# Patient Record
Sex: Female | Born: 1975 | Hispanic: No | Marital: Married | Smoking: Never smoker
Health system: Southern US, Community
[De-identification: ages and names within clinical notes are randomized; demographics above are authoritative.]

## PROBLEM LIST (undated history)

## (undated) DIAGNOSIS — I10 Essential (primary) hypertension: Secondary | ICD-10-CM

## (undated) HISTORY — DX: Essential (primary) hypertension: I10

---

## 1998-09-30 ENCOUNTER — Emergency Department (HOSPITAL_COMMUNITY): Admission: EM | Admit: 1998-09-30 | Discharge: 1998-09-30 | Payer: Self-pay | Admitting: Internal Medicine

## 2004-01-17 ENCOUNTER — Inpatient Hospital Stay (HOSPITAL_COMMUNITY): Admission: AD | Admit: 2004-01-17 | Discharge: 2004-01-17 | Payer: Self-pay | Admitting: *Deleted

## 2004-01-20 ENCOUNTER — Ambulatory Visit (HOSPITAL_COMMUNITY): Admission: RE | Admit: 2004-01-20 | Discharge: 2004-01-20 | Payer: Self-pay | Admitting: Family Medicine

## 2004-02-07 ENCOUNTER — Encounter: Admission: RE | Admit: 2004-02-07 | Discharge: 2004-02-07 | Payer: Self-pay | Admitting: Family Medicine

## 2004-03-06 ENCOUNTER — Encounter: Admission: RE | Admit: 2004-03-06 | Discharge: 2004-03-06 | Payer: Self-pay | Admitting: Family Medicine

## 2004-09-03 ENCOUNTER — Inpatient Hospital Stay (HOSPITAL_COMMUNITY): Admission: AD | Admit: 2004-09-03 | Discharge: 2004-09-03 | Payer: Self-pay | Admitting: Obstetrics & Gynecology

## 2004-09-24 ENCOUNTER — Ambulatory Visit: Payer: Self-pay | Admitting: Family Medicine

## 2004-10-01 ENCOUNTER — Ambulatory Visit: Payer: Self-pay | Admitting: Family Medicine

## 2004-10-08 ENCOUNTER — Ambulatory Visit: Payer: Self-pay | Admitting: Family Medicine

## 2004-10-22 ENCOUNTER — Ambulatory Visit: Payer: Self-pay | Admitting: Family Medicine

## 2004-11-12 ENCOUNTER — Ambulatory Visit: Payer: Self-pay | Admitting: Family Medicine

## 2004-11-23 ENCOUNTER — Ambulatory Visit (HOSPITAL_COMMUNITY): Admission: RE | Admit: 2004-11-23 | Discharge: 2004-11-23 | Payer: Self-pay | Admitting: *Deleted

## 2004-12-03 ENCOUNTER — Ambulatory Visit: Payer: Self-pay | Admitting: Family Medicine

## 2004-12-08 ENCOUNTER — Ambulatory Visit: Payer: Self-pay | Admitting: *Deleted

## 2004-12-11 ENCOUNTER — Ambulatory Visit (HOSPITAL_COMMUNITY): Admission: RE | Admit: 2004-12-11 | Discharge: 2004-12-11 | Payer: Self-pay | Admitting: *Deleted

## 2004-12-24 ENCOUNTER — Ambulatory Visit: Payer: Self-pay | Admitting: Family Medicine

## 2005-01-07 ENCOUNTER — Ambulatory Visit: Payer: Self-pay | Admitting: Family Medicine

## 2005-01-21 ENCOUNTER — Ambulatory Visit: Payer: Self-pay | Admitting: Family Medicine

## 2005-02-04 ENCOUNTER — Ambulatory Visit: Payer: Self-pay | Admitting: Family Medicine

## 2005-02-18 ENCOUNTER — Ambulatory Visit: Payer: Self-pay | Admitting: Family Medicine

## 2005-03-04 ENCOUNTER — Ambulatory Visit: Payer: Self-pay | Admitting: Obstetrics & Gynecology

## 2005-03-18 ENCOUNTER — Ambulatory Visit: Payer: Self-pay | Admitting: *Deleted

## 2005-03-25 ENCOUNTER — Ambulatory Visit: Payer: Self-pay | Admitting: Family Medicine

## 2005-04-01 ENCOUNTER — Ambulatory Visit: Payer: Self-pay | Admitting: Family Medicine

## 2005-04-06 ENCOUNTER — Inpatient Hospital Stay (HOSPITAL_COMMUNITY): Admission: AD | Admit: 2005-04-06 | Discharge: 2005-04-08 | Payer: Self-pay | Admitting: *Deleted

## 2006-04-21 ENCOUNTER — Inpatient Hospital Stay (HOSPITAL_COMMUNITY): Admission: AD | Admit: 2006-04-21 | Discharge: 2006-04-21 | Payer: Self-pay | Admitting: Obstetrics and Gynecology

## 2006-06-02 ENCOUNTER — Other Ambulatory Visit: Admission: RE | Admit: 2006-06-02 | Discharge: 2006-06-02 | Payer: Self-pay | Admitting: Obstetrics and Gynecology

## 2006-11-06 ENCOUNTER — Inpatient Hospital Stay (HOSPITAL_COMMUNITY): Admission: AD | Admit: 2006-11-06 | Discharge: 2006-11-06 | Payer: Self-pay | Admitting: Obstetrics and Gynecology

## 2006-11-10 ENCOUNTER — Inpatient Hospital Stay (HOSPITAL_COMMUNITY): Admission: AD | Admit: 2006-11-10 | Discharge: 2006-11-12 | Payer: Self-pay | Admitting: Obstetrics and Gynecology

## 2010-10-25 ENCOUNTER — Encounter: Payer: Self-pay | Admitting: *Deleted

## 2012-12-25 ENCOUNTER — Ambulatory Visit (INDEPENDENT_AMBULATORY_CARE_PROVIDER_SITE_OTHER): Payer: 59 | Admitting: Physician Assistant

## 2012-12-25 VITALS — BP 152/98 | HR 84 | Temp 98.0°F | Resp 16 | Ht 62.0 in | Wt 140.8 lb

## 2012-12-25 DIAGNOSIS — Z131 Encounter for screening for diabetes mellitus: Secondary | ICD-10-CM

## 2012-12-25 DIAGNOSIS — L259 Unspecified contact dermatitis, unspecified cause: Secondary | ICD-10-CM

## 2012-12-25 DIAGNOSIS — L237 Allergic contact dermatitis due to plants, except food: Secondary | ICD-10-CM

## 2012-12-25 DIAGNOSIS — L255 Unspecified contact dermatitis due to plants, except food: Secondary | ICD-10-CM

## 2012-12-25 LAB — GLUCOSE, POCT (MANUAL RESULT ENTRY): POC Glucose: 96 mg/dl (ref 70–99)

## 2012-12-25 MED ORDER — CLOBETASOL PROPIONATE 0.05 % EX CREA: TOPICAL_CREAM | Freq: Two times a day (BID) | CUTANEOUS | Status: AC

## 2012-12-25 MED ORDER — PREDNISONE 20 MG PO TABS: ORAL_TABLET | ORAL | Status: AC

## 2012-12-25 NOTE — Progress Notes (Addendum)
  Subjective:    Patient ID: Jessica Baldwin, female    DOB: 1976/04/01, 37 y.o.   MRN: [id number]  HPI 37 year old female presents with 1 week history of pruritic rash on bilateral forearms. States symptoms started on 12/18/12 the day after she was working out in the yard cutting trees.  The rash has progressively worsened and become more swollen and pruritic.  Has washed all clothes from that day. She tried OTC hydrocortisone cream yesterday which has helped with the itch.  BP also slightly elevated today and does admit she has been told this in the past but has never been diagnosed with HTN.    Otherwise healthy with no other concerns today.   She presented today with her daughter who translated for her.     Review of Systems  Constitutional: Negative for fever and chills.  Gastrointestinal: Negative for nausea and vomiting.  Musculoskeletal: Negative for joint swelling.  Skin: Positive for color change and rash. Negative for wound.       Objective:   Physical Exam  Constitutional: She is oriented to person, place, and time. She appears well-developed and well-nourished.  HENT:  Head: Normocephalic and atraumatic.  Right Ear: External ear normal.  Left Ear: External ear normal.  Eyes: Conjunctivae are normal.  Neck: Normal range of motion.  Cardiovascular: Normal rate, regular rhythm and normal heart sounds.   Pulmonary/Chest: Effort normal and breath sounds normal.  Neurological: She is alert and oriented to person, place, and time.  Skin:     No warmth, induration or purulent drainage. There is some serous drainage from the rash  Psychiatric: She has a normal mood and affect. Her behavior is normal. Judgment and thought content normal.          Assessment & Plan:  Poison ivy dermatitis  Contact dermatitis  Screening for diabetes mellitus - Plan: POCT glucose (manual entry) Prednisone taper Clobetasol cream twice daily Zyrtec daily in the morning. Benadryl  25-50 mg at bedtime Follow up if symptoms worsen or fail to improve.   Discussed elevated BP today - recommend she take a few at home readings over the next month and if number continue to be elevated above 140/90, recommend RTC for further evaluation of HTN.

## 2012-12-25 NOTE — Patient Instructions (Addendum)
Prednisone taper Clobetasol cream twice daily Zyrtec daily in the morning. Benadryl 25-50 mg at bedtime

## 2021-04-08 ENCOUNTER — Other Ambulatory Visit: Payer: Self-pay | Admitting: Obstetrics & Gynecology

## 2021-04-08 DIAGNOSIS — Z1231 Encounter for screening mammogram for malignant neoplasm of breast: Secondary | ICD-10-CM

## 2021-05-14 ENCOUNTER — Other Ambulatory Visit: Payer: Self-pay

## 2021-05-14 ENCOUNTER — Ambulatory Visit
Admission: RE | Admit: 2021-05-14 | Discharge: 2021-05-14 | Disposition: A | Discharge status: Home / Self Care | Payer: No Typology Code available for payment source | Source: Ambulatory Visit | Attending: Obstetrics & Gynecology | Admitting: Obstetrics & Gynecology

## 2021-05-14 DIAGNOSIS — Z1231 Encounter for screening mammogram for malignant neoplasm of breast: Secondary | ICD-10-CM

## 2021-05-18 ENCOUNTER — Other Ambulatory Visit: Payer: Self-pay | Admitting: Obstetrics and Gynecology

## 2021-05-18 DIAGNOSIS — R928 Other abnormal and inconclusive findings on diagnostic imaging of breast: Secondary | ICD-10-CM

## 2021-06-11 ENCOUNTER — Other Ambulatory Visit: Payer: No Typology Code available for payment source

## 2021-06-11 ENCOUNTER — Ambulatory Visit: Payer: No Typology Code available for payment source

## 2021-06-30 ENCOUNTER — Ambulatory Visit: Payer: Self-pay | Admitting: *Deleted

## 2021-06-30 ENCOUNTER — Other Ambulatory Visit: Payer: Self-pay

## 2021-06-30 ENCOUNTER — Encounter (INDEPENDENT_AMBULATORY_CARE_PROVIDER_SITE_OTHER): Payer: Self-pay

## 2021-06-30 ENCOUNTER — Ambulatory Visit: Payer: No Typology Code available for payment source

## 2021-06-30 ENCOUNTER — Ambulatory Visit
Admission: RE | Admit: 2021-06-30 | Discharge: 2021-06-30 | Disposition: A | Discharge status: Home / Self Care | Payer: No Typology Code available for payment source | Source: Ambulatory Visit | Attending: Obstetrics and Gynecology | Admitting: Obstetrics and Gynecology

## 2021-06-30 VITALS — BP 136/82 | Wt 143.8 lb

## 2021-06-30 DIAGNOSIS — Z1211 Encounter for screening for malignant neoplasm of colon: Secondary | ICD-10-CM

## 2021-06-30 DIAGNOSIS — Z1239 Encounter for other screening for malignant neoplasm of breast: Secondary | ICD-10-CM

## 2021-06-30 DIAGNOSIS — R928 Other abnormal and inconclusive findings on diagnostic imaging of breast: Secondary | ICD-10-CM

## 2021-06-30 NOTE — Progress Notes (Signed)
Ms. Saje Gallop is a 45 y.o. female who presents to [facility] clinic today with no complaints. Patient referred to [facility] by the [facility] due to recommending additional imaging of the right breast. Screening mammogram completed 05/14/2021.    Pap Smear: Pap smear not completed today. Last Pap smear was in June 2022 at the [facility] clinic and was normal with negative HPV per patient. Per patient has no history of an abnormal Pap smear. Last Pap smear result is not available in Epic.   Physical exam: Breasts Breasts symmetrical. No skin abnormalities bilateral breasts. No nipple retraction bilateral breasts. No nipple discharge bilateral breasts. No lymphadenopathy. No lumps palpated bilateral breasts. No complaints of pain or tenderness on exam.    MS DIGITAL SCREENING TOMO BILATERAL  Result Date: 05/17/2021 CLINICAL DATA:  Screening. EXAM: DIGITAL SCREENING BILATERAL MAMMOGRAM WITH TOMOSYNTHESIS AND CAD TECHNIQUE: Bilateral screening digital craniocaudal and mediolateral oblique mammograms were obtained. Bilateral screening digital breast tomosynthesis was performed. The images were evaluated with computer-aided detection. COMPARISON:  None. ACR Breast Density Category c: The breast tissue is heterogeneously dense, which may obscure small masses. FINDINGS: In the right breast, a possible mass warrants further evaluation. In the left breast, no findings suspicious for malignancy. IMPRESSION: Further evaluation is suggested for a possible mass in the right breast. RECOMMENDATION: Diagnostic mammogram and possibly ultrasound of the right breast. (Code:[id number]) The patient will be contacted regarding the findings, and additional imaging will be scheduled. BI-RADS CATEGORY  0: Incomplete. Need additional imaging evaluation and/or prior mammograms for comparison. Electronically Signed   By: Frederico Hamman M.D.   On: 05/17/2021 07:29      Pelvic/Bimanual Pap is not  indicated today per BCCCP guidelines.   Smoking History: Patient has never smoked.   Patient Navigation: Patient education provided. Access to services provided for patient through [place] program. Spanish interpreter Natale Lay from [facility] provided.   Colorectal Cancer Screening: Per patient has never had colonoscopy completed. FIT Test given to patient to complete. No complaints today.    Breast and Cervical Cancer Risk Assessment: Patient has family history of a maternal aunt and paternal aunt having breast cancer. Patient has no known genetic mutations or history of radiation treatment to the chest before age 63. Patient does not have history of cervical dysplasia, immunocompromised, or DES exposure in-utero.  Risk Assessment     Risk Scores       06/30/2021   Last edited by: Narda Rutherford, LPN   5-year risk: 0.5 %   Lifetime risk: 6.1 %            A: BCCCP exam without pap smear No complaints.  P: Referred patient to the [facility] for a right breast diagnostic mammogram per recommendation. Appointment scheduled Tuesday, June 30, 2021 at 1240.  Priscille Heidelberg, RN 06/30/2021 10:41 AM

## 2021-06-30 NOTE — Patient Instructions (Addendum)
Explained breast self awareness with [employer]. Patient did not need a Pap smear today due to last Pap smear and HPV typing was in June 2022 per patient. Let her know BCCCP will cover Pap smears and HPV typing every 5 years unless has a history of abnormal Pap smears. Referred patient to the [facility] for a right breast diagnostic mammogram per recommendation. Appointment scheduled Tuesday, June 30, 2021 at 1240. Patient aware of appointment and will be there. Jessica Baldwin verbalized understanding.  Vladimir Lenhoff, Kathaleen Maser, RN 10:41 AM

## 2022-05-01 IMAGING — MG MM DIGITAL DIAGNOSTIC UNILAT*R* W/ TOMO W/ CAD
4 series · 4 of 12 positions shown · non-contrast
Comparison: Previous exam(s).

CLINICAL DATA: 45-year-old female recalled from baseline screening
mammogram dated 05/14/2021 for a possible right breast mass.

EXAM:
DIGITAL DIAGNOSTIC UNILATERAL RIGHT MAMMOGRAM WITH TOMOSYNTHESIS AND
CAD
TECHNIQUE: Right digital diagnostic mammography and breast tomosynthesis was
performed. The images were evaluated with computer-aided detection.

[R MLO synth-2D]
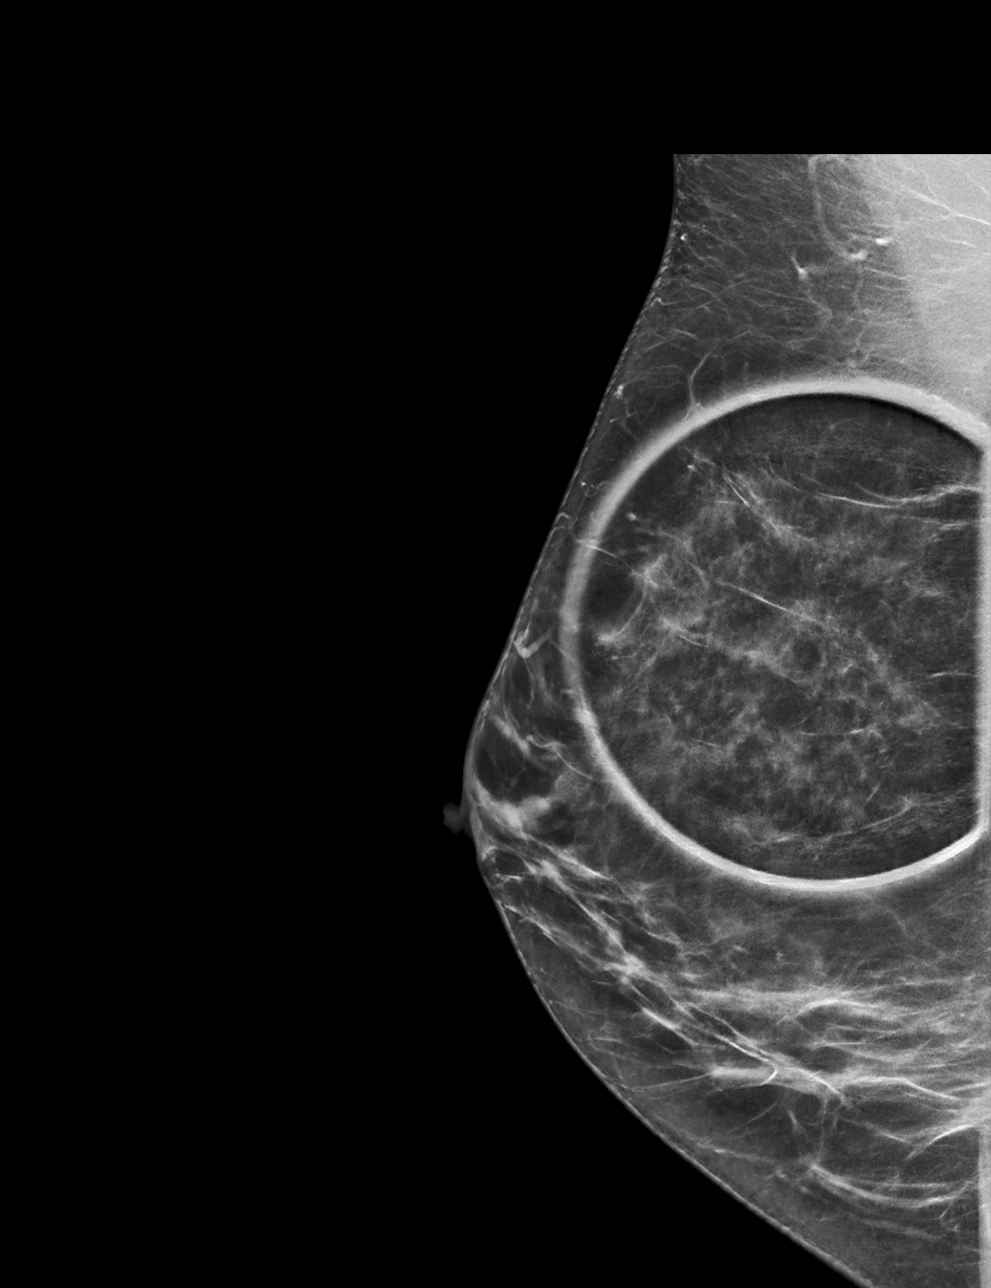

[R CC synth-2D]
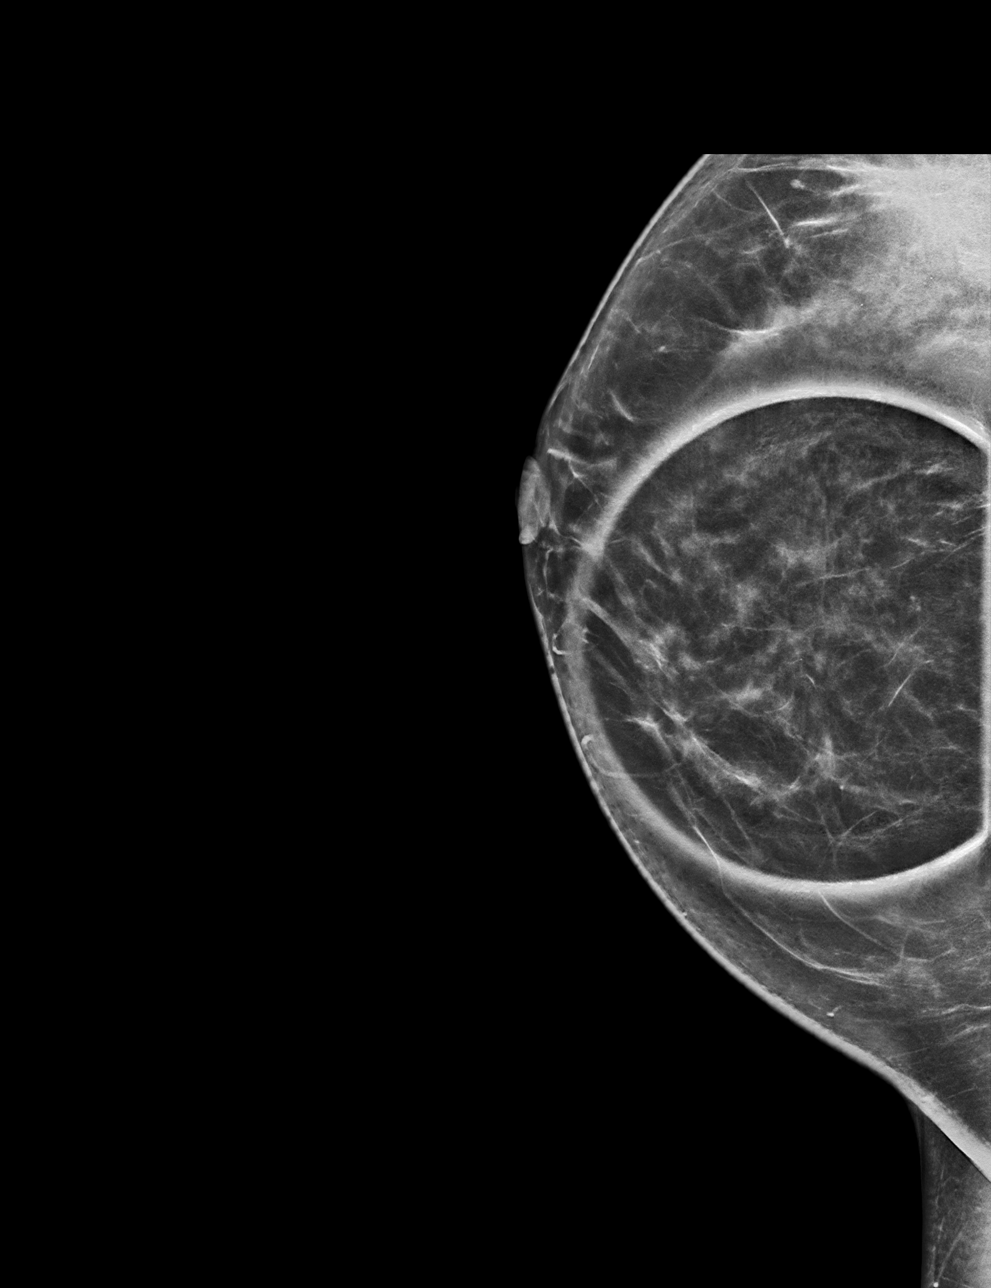

[R CC tomo · tomo slice 37/73.0]
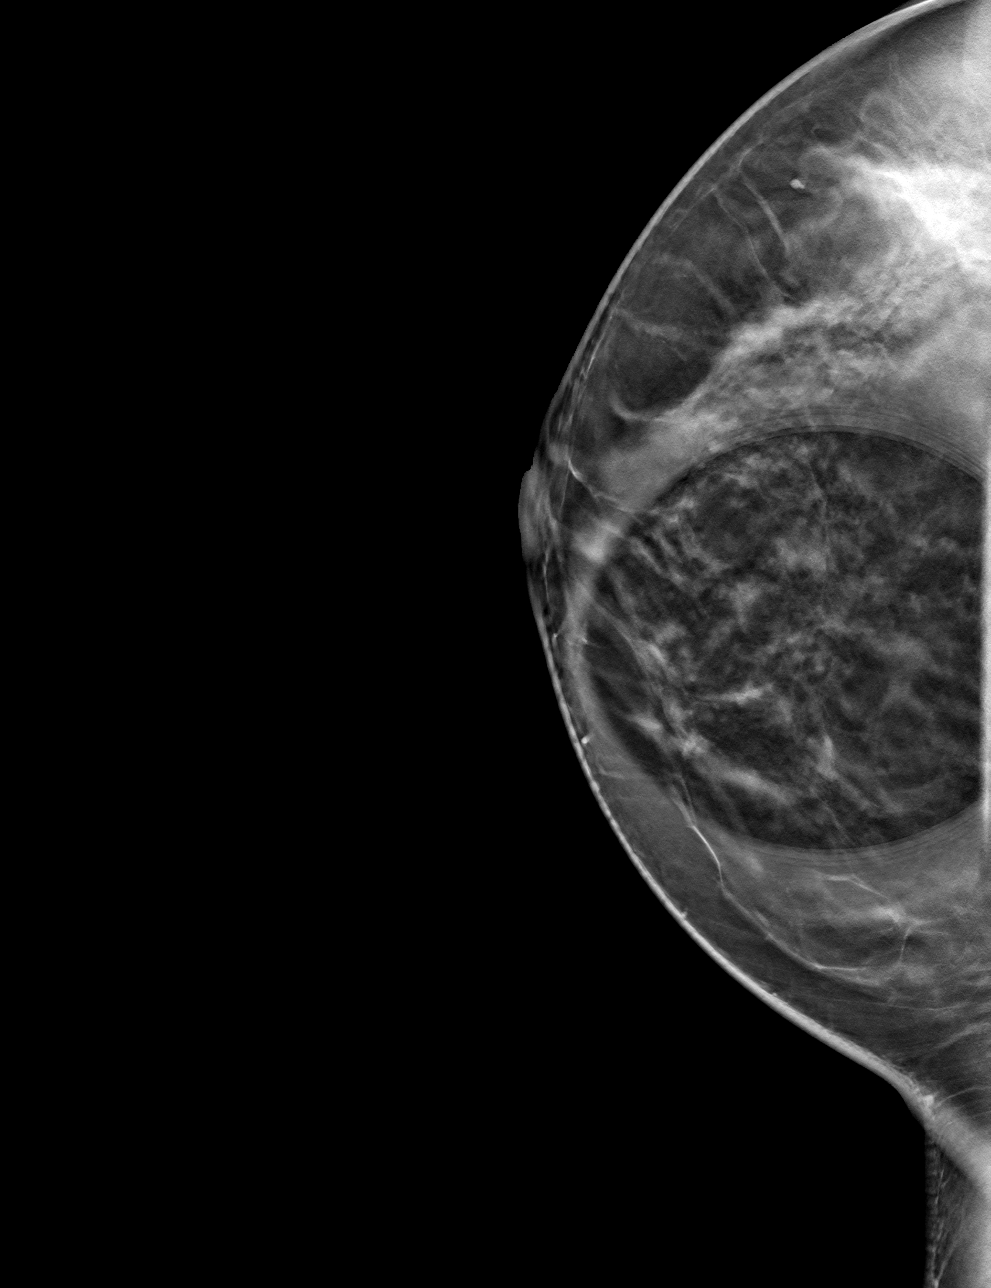

[R MLO tomo · tomo slice 37/73.0]
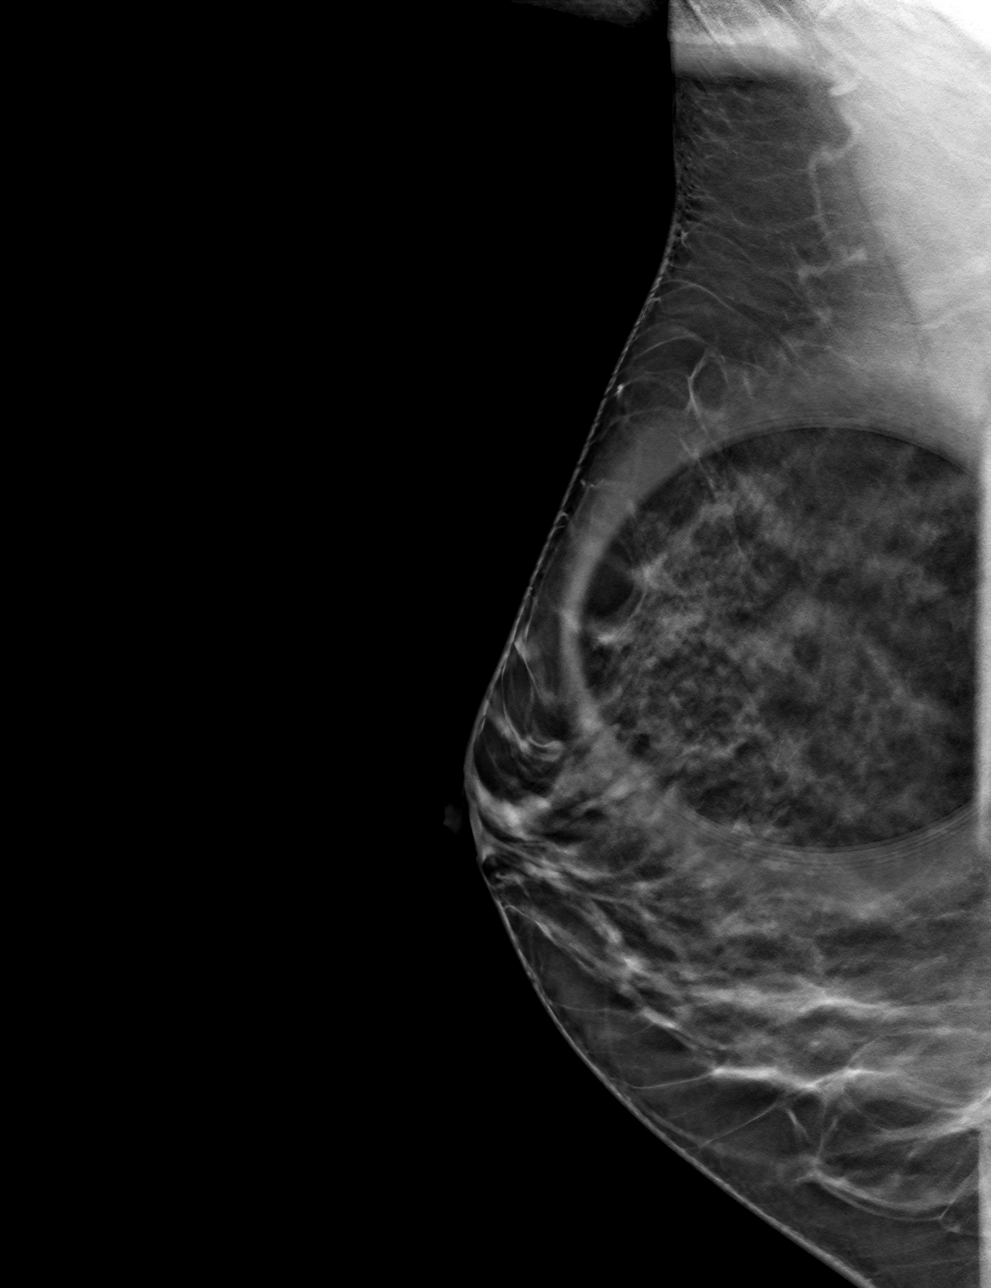

[4 of 12 positions shown; findings below may reference images not displayed]

ACR Breast Density Category c: The breast tissue is heterogeneously
dense, which may obscure small masses.
FINDINGS: Previously described, possible mass in the upper inner right breast
is no longer identified on today's additional views. This likely
represents a resolved/ruptured cyst.
IMPRESSION: No mammographic evidence of malignancy in the right breast.

RECOMMENDATION:
Screening mammogram in one year.(Code:[id number])

I have discussed the findings and recommendations with the patient.
If applicable, a reminder letter will be sent to the patient
regarding the next appointment.

BI-RADS CATEGORY  1: Negative.

## 2024-05-03 ENCOUNTER — Telehealth: Payer: Self-pay

## 2024-05-03 ENCOUNTER — Other Ambulatory Visit: Payer: Self-pay | Admitting: Obstetrics & Gynecology

## 2024-05-03 DIAGNOSIS — Z1231 Encounter for screening mammogram for malignant neoplasm of breast: Secondary | ICD-10-CM

## 2024-05-17 ENCOUNTER — Ambulatory Visit
Admission: RE | Admit: 2024-05-17 | Discharge: 2024-05-17 | Disposition: A | Discharge status: Home / Self Care | Payer: Self-pay | Source: Ambulatory Visit | Attending: Obstetrics & Gynecology | Admitting: Obstetrics & Gynecology

## 2024-05-17 DIAGNOSIS — Z1231 Encounter for screening mammogram for malignant neoplasm of breast: Secondary | ICD-10-CM

## 2024-05-22 ENCOUNTER — Ambulatory Visit: Payer: Self-pay | Admitting: Obstetrics & Gynecology
